# Patient Record
Sex: Male | Born: 1990 | Race: Black or African American | Hispanic: No | Marital: Married | Smoking: Never smoker
Health system: Southern US, Community
[De-identification: ages and names within clinical notes are randomized; demographics above are authoritative.]

## PROBLEM LIST (undated history)

## (undated) DIAGNOSIS — G43909 Migraine, unspecified, not intractable, without status migrainosus: Secondary | ICD-10-CM

## (undated) DIAGNOSIS — J45909 Unspecified asthma, uncomplicated: Secondary | ICD-10-CM

## (undated) HISTORY — PX: SKIN GRAFT: SHX250

---
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
## 2011-04-10 LAB — URINALYSIS, ROUTINE W REFLEX MICROSCOPIC
Ketones, ur: NEGATIVE
Leukocytes, UA: NEGATIVE
Protein, ur: 30 — AB
Specific Gravity, Urine: 1.021
Urobilinogen, UA: 0.2

## 2011-04-10 LAB — URINE MICROSCOPIC-ADD ON

## 2011-04-10 LAB — DIFFERENTIAL
Basophils Relative: 0
Eosinophils Relative: 6 — ABNORMAL HIGH
Neutro Abs: 3.4

## 2011-04-10 LAB — CBC
HCT: 42.6
Hemoglobin: 14.8
Platelets: 177
RBC: 4.52
WBC: 5.7

## 2011-04-10 LAB — BASIC METABOLIC PANEL
CO2: 22
Calcium: 9.2
Creatinine, Ser: 0.86
GFR calc non Af Amer: >60
Glucose, Bld: 113 — ABNORMAL HIGH
Sodium: 138

## 2011-04-10 LAB — RAPID URINE DRUG SCREEN, HOSP PERFORMED
Amphetamines: NOT DETECTED
Benzodiazepines: NOT DETECTED
Cocaine: NOT DETECTED
Opiates: NOT DETECTED
Tetrahydrocannabinol: NOT DETECTED

## 2012-03-04 ENCOUNTER — Encounter (HOSPITAL_COMMUNITY): Payer: Self-pay | Admitting: *Deleted

## 2012-03-04 ENCOUNTER — Emergency Department (HOSPITAL_COMMUNITY)
Admission: EM | Admit: 2012-03-04 | Discharge: 2012-03-04 | Disposition: A | Discharge status: Home / Self Care | Payer: Self-pay | Attending: Emergency Medicine | Admitting: Emergency Medicine

## 2012-03-04 DIAGNOSIS — J40 Bronchitis, not specified as acute or chronic: Secondary | ICD-10-CM | POA: Insufficient documentation

## 2012-03-04 DIAGNOSIS — J45901 Unspecified asthma with (acute) exacerbation: Secondary | ICD-10-CM | POA: Insufficient documentation

## 2012-03-04 HISTORY — DX: Unspecified asthma, uncomplicated: J45.909

## 2012-03-04 MED ORDER — ALBUTEROL SULFATE HFA 108 (90 BASE) MCG/ACT IN AERS
2.0000 | INHALATION_SPRAY | Freq: Once | RESPIRATORY_TRACT | Status: AC
  Administered 2012-03-04: 2 via RESPIRATORY_TRACT
  Filled 2012-03-04: qty 6.7

## 2012-03-04 MED ORDER — AEROCHAMBER Z-STAT PLUS/MEDIUM MISC
1.0000 | Freq: Once | Status: AC
  Administered 2012-03-04: 1
  Filled 2012-03-04: qty 1

## 2012-03-04 MED ORDER — AMOXICILLIN 500 MG PO CAPS: 500.0000 mg | ORAL_CAPSULE | Freq: Three times a day (TID) | ORAL | Status: AC

## 2012-03-04 MED ORDER — PREDNISONE 20 MG PO TABS: ORAL_TABLET | ORAL | Status: DC

## 2012-03-04 MED ORDER — PREDNISONE 20 MG PO TABS
60.0000 mg | ORAL_TABLET | Freq: Once | ORAL | Status: AC
  Administered 2012-03-04: 60 mg via ORAL
  Filled 2012-03-04: qty 3

## 2012-03-04 NOTE — ED Provider Notes (Signed)
History     CSN: [id number]  Arrival date & time 03/04/12  0818   First MD Initiated Contact with Patient 03/04/12 0901      Chief Complaint  Patient presents with  . Asthma    (Consider location/radiation/quality/duration/timing/severity/associated sxs/prior treatment) HPI  Patient reports he has had a history of asthma since he was a child. He relates it was worse when he was a child and it seems to be getting better as an adult. He relates the last time he had to come to the ED was over a year ago. He states he's never had to be hospitalized for his asthma. He states it mainly bothers him at changes of weather or if he gets cold symptoms. He states last night he started having a cough with yellow sputum production and he is having yellow rhinorrhea. He's unsure if he is having fever however he states he's been sweating. He denies sore throat, nausea, vomiting, or diarrhea. He states he is wheezing in his chest feels tight. He states he does not have an inhaler. Patient reports he moved here 3 months ago and does not have a primary care doctor. He is unfamiliar with the term steroids or prednisone.  PCP none  Past Medical History  Diagnosis Date  . Asthma     Past Surgical History  Procedure Date  . Skin graft     right hand    No family history on file.  History  Substance Use Topics  . Smoking status: Never Smoker   . Smokeless tobacco: Not on file  . Alcohol Use: Yes   Employed in [employer] moved here 3 months ago  Review of Systems  All other systems reviewed and are negative.    Allergies  Review of patient's allergies indicates no known allergies.  Home Medications   Current Outpatient Rx  Name Route Sig Dispense Refill  . ASPIRIN EFFERVESCENT 325 MG PO TBEF Oral Take 325 mg by mouth every 6 (six) hours as needed. For cold /sinus      BP 129/79  Pulse 77  Temp 98.3 F (36.8 C) (Oral)  Resp 20  Ht 6' (1.829 m)  Wt 164 lb (74.39 kg)  BMI 22.24  kg/m2  SpO2 95%  Vital signs normal    Physical Exam  Nursing note and vitals reviewed. Constitutional: He is oriented to person, place, and time. He appears well-developed and well-nourished.  Non-toxic appearance. He does not appear ill. No distress.  HENT:  Head: Normocephalic and atraumatic.  Right Ear: External ear normal.  Left Ear: External ear normal.  Nose: Nose normal. No mucosal edema or rhinorrhea.  Mouth/Throat: Oropharynx is clear and moist and mucous membranes are normal. No dental abscesses or uvula swelling.  Eyes: Conjunctivae and EOM are normal. Pupils are equal, round, and reactive to light.  Neck: Normal range of motion and full passive range of motion without pain. Neck supple.  Cardiovascular: Normal rate, regular rhythm and normal heart sounds.  Exam reveals no gallop and no friction rub.   No murmur heard. Pulmonary/Chest: Effort normal. No respiratory distress. He has wheezes. He has no rhonchi. He has no rales. He exhibits no tenderness and no crepitus.       Patient has diffuse bilateral end expiratory high-pitched wheezing scattered  Abdominal: Soft. Normal appearance and bowel sounds are normal. He exhibits no distension. There is no tenderness. There is no rebound and no guarding.  Musculoskeletal: Normal range of motion. He exhibits no  edema and no tenderness.       Moves all extremities well.   Neurological: He is alert and oriented to person, place, and time. He has normal strength. No cranial nerve deficit.  Skin: Skin is warm, dry and intact. No rash noted. No erythema. No pallor.  Psychiatric: He has a normal mood and affect. His speech is normal and behavior is normal. His mood appears not anxious.    ED Course  Procedures (including critical care time)    Medications  albuterol (PROVENTIL HFA;VENTOLIN HFA) 108 (90 BASE) MCG/ACT inhaler (not administered)  aerochamber Z-Stat Plus/medium 1 each (not administered)  albuterol (PROVENTIL  HFA;VENTOLIN HFA) 108 (90 BASE) MCG/ACT inhaler 2 puff (2 puff Inhalation Given 03/04/12 0923)  predniSONE (DELTASONE) tablet 60 mg (60 mg Oral Given 03/04/12 0924)   Recheck 10:25 after first 2 puffs. Has improved air movement with end expir rhonchi mainly on the right. Will repeat.  Recheck 11:30 recheck after second round of 2 puffs of albuterol inhaler has faint rare inspir wheeze feels ready to go home.   1. Asthma exacerbation   2. Bronchitis    New Prescriptions   AMOXICILLIN (AMOXIL) 500 MG CAPSULE    Take 1 capsule (500 mg total) by mouth 3 (three) times daily.   PREDNISONE (DELTASONE) 20 MG TABLET    Take 3 po QD x 2d starting tomorrow, then 2 po QD x 3d then 1 po QD x 3d    Plan discharge  Devoria Albe, MD, FACEP    MDM          Ward Givens, MD 03/04/12 [phone]

## 2012-03-04 NOTE — ED Notes (Signed)
Pt took 2 more puffs of albuterol inhaler

## 2012-03-04 NOTE — ED Notes (Signed)
Pt from home, reports hx of asthma, has been wheezing intermittently since last night. Woke up this morning still wheezing. Reports URI symptoms x 3 days - Non-productive cough and nasal drainage present. Pt states he uses inhaler but does not currently have one.

## 2013-01-27 ENCOUNTER — Emergency Department (HOSPITAL_COMMUNITY)
Admission: EM | Admit: 2013-01-27 | Discharge: 2013-01-27 | Disposition: A | Discharge status: Home / Self Care | Payer: 59 | Attending: Emergency Medicine | Admitting: Emergency Medicine

## 2013-01-27 ENCOUNTER — Emergency Department (HOSPITAL_COMMUNITY): Payer: 59

## 2013-01-27 DIAGNOSIS — Z79899 Other long term (current) drug therapy: Secondary | ICD-10-CM | POA: Insufficient documentation

## 2013-01-27 DIAGNOSIS — J3489 Other specified disorders of nose and nasal sinuses: Secondary | ICD-10-CM | POA: Insufficient documentation

## 2013-01-27 DIAGNOSIS — R059 Cough, unspecified: Secondary | ICD-10-CM | POA: Insufficient documentation

## 2013-01-27 DIAGNOSIS — J019 Acute sinusitis, unspecified: Secondary | ICD-10-CM | POA: Insufficient documentation

## 2013-01-27 DIAGNOSIS — R0989 Other specified symptoms and signs involving the circulatory and respiratory systems: Secondary | ICD-10-CM | POA: Insufficient documentation

## 2013-01-27 DIAGNOSIS — J329 Chronic sinusitis, unspecified: Secondary | ICD-10-CM

## 2013-01-27 DIAGNOSIS — J029 Acute pharyngitis, unspecified: Secondary | ICD-10-CM | POA: Insufficient documentation

## 2013-01-27 DIAGNOSIS — R05 Cough: Secondary | ICD-10-CM | POA: Insufficient documentation

## 2013-01-27 DIAGNOSIS — J45909 Unspecified asthma, uncomplicated: Secondary | ICD-10-CM | POA: Insufficient documentation

## 2013-01-27 MED ORDER — ALBUTEROL SULFATE (5 MG/ML) 0.5% IN NEBU
5.0000 mg | INHALATION_SOLUTION | Freq: Once | RESPIRATORY_TRACT | Status: AC
  Administered 2013-01-27: 5 mg via RESPIRATORY_TRACT
  Filled 2013-01-27: qty 1

## 2013-01-27 MED ORDER — IPRATROPIUM BROMIDE 0.02 % IN SOLN
0.5000 mg | Freq: Once | RESPIRATORY_TRACT | Status: AC
  Administered 2013-01-27: 0.5 mg via RESPIRATORY_TRACT
  Filled 2013-01-27: qty 2.5

## 2013-01-27 MED ORDER — ALBUTEROL SULFATE HFA 108 (90 BASE) MCG/ACT IN AERS
2.0000 | INHALATION_SPRAY | Freq: Once | RESPIRATORY_TRACT | Status: AC
  Administered 2013-01-27: 2 via RESPIRATORY_TRACT
  Filled 2013-01-27: qty 6.7

## 2013-01-27 MED ORDER — AMOXICILLIN 500 MG PO CAPS: 500.0000 mg | ORAL_CAPSULE | Freq: Three times a day (TID) | ORAL | Status: DC

## 2013-01-27 NOTE — ED Notes (Signed)
PA at bedside.

## 2013-01-27 NOTE — ED Provider Notes (Signed)
CSN: [id number]     Arrival date & time 01/27/13  1250 History     First MD Initiated Contact with Patient 01/27/13 1316     Chief Complaint  Patient presents with  . Headache   (Consider location/radiation/quality/duration/timing/severity/associated sxs/prior Treatment) HPI Comments: Patient presents to the emergency department with chief complaint of persistent sinus and chest congestion. He states that this been ongoing for the past week. He endorses nasal discharge, sore throat, productive cough, and thinks that his asthma might be flaring up some. Additionally, he states that he has had a sinus headache. He is tried taking NyQuil and Alka-Seltzer with some relief. He denies fevers, chills, nausea, vomiting, diarrhea, constipation, numbness or tingling of the extremities.  The history is provided by the patient. No language interpreter was used.    Past Medical History  Diagnosis Date  . Asthma    Past Surgical History  Procedure Laterality Date  . Skin graft      right hand   No family history on file. History  Substance Use Topics  . Smoking status: Never Smoker   . Smokeless tobacco: Not on file  . Alcohol Use: Yes    Review of Systems  All other systems reviewed and are negative.    Allergies  Review of patient's allergies indicates no known allergies.  Home Medications   Current Outpatient Rx  Name  Route  Sig  Dispense  Refill  . albuterol (PROVENTIL HFA;VENTOLIN HFA) 108 (90 BASE) MCG/ACT inhaler   Inhalation   Inhale 2 puffs into the lungs every 6 (six) hours as needed. breathing         . aspirin-sod bicarb-citric acid (ALKA-SELTZER) 325 MG TBEF   Oral   Take 325 mg by mouth every 6 (six) hours as needed. For cold /sinus         . Pseudoeph-Doxylamine-DM-APAP (NYQUIL MULTI-SYMPTOM PO)   Oral   Take by mouth.         . predniSONE (DELTASONE) 20 MG tablet      Take 3 po QD x 2d starting tomorrow, then 2 po QD x 3d then 1 po QD x 3d   15  tablet   0    BP 125/68  Pulse 75  Temp(Src) 97.8 F (36.6 C) (Oral)  Resp 18  SpO2 98% Physical Exam  Nursing note and vitals reviewed. Constitutional: He is oriented to person, place, and time. He appears well-developed and well-nourished.  HENT:  Head: Normocephalic and atraumatic.  Right Ear: External ear normal.  Left Ear: External ear normal.  Mouth/Throat: Oropharynx is clear and moist. No oropharyngeal exudate.  Swollen, erythematous turbinates, maxillary sinuses tender to palpation  Eyes: Conjunctivae and EOM are normal. Pupils are equal, round, and reactive to light.  Neck: Normal range of motion. Neck supple.  Cardiovascular: Normal rate, regular rhythm and normal heart sounds.   Pulmonary/Chest: Effort normal and breath sounds normal. No respiratory distress. He has no wheezes. He has no rales. He exhibits no tenderness.  Abdominal: Soft. Bowel sounds are normal.  Musculoskeletal: Normal range of motion.  Neurological: He is alert and oriented to person, place, and time.  Skin: Skin is warm and dry.  Psychiatric: He has a normal mood and affect. His behavior is normal. Judgment and thought content normal.    ED Course   Procedures (including critical care time) Dg Chest 2 View  01/27/2013   *RADIOLOGY REPORT*  Clinical Data: Shortness of breath  CHEST - 2 VIEW  Comparison:  December 30, 2006  Findings: Lungs clear.  Heart size and pulmonary vascularity are normal.  No adenopathy.  No bone lesions.  IMPRESSION: No abnormality noted.   Original Report Authenticated By: Bretta Bang, M.D.     1. Sinusitis     MDM  Patient with sinus congestion and pressure times one week. Patient states that he thinks his asthma is acting up. Will give him a nebulizer treatment.  3:33 PM Patient states that he is feeling much better after nebulizer treatment. Will discharge to home with antibiotics for sinusitis. Will give the patient an inhaler to go home with as well. Patient is  stable and ready for discharge. He is agreeable with the plan.  Roxy Horseman, PA-C 01/27/13 [phone]

## 2013-01-27 NOTE — Progress Notes (Signed)
Patient cofirms his pcp is with [facility].

## 2013-01-27 NOTE — ED Notes (Addendum)
Patient reports sore throat times one week and 8/10 headache starting yesterday afternoon, which patient believes to be sinus related and states it is aggravating his asthma, leading to chest tightness. Patient reports that chest tightness feels similar to other asthma flare-ups. Patient denies dizziness, SOB, blurred vision.

## 2013-01-29 NOTE — ED Provider Notes (Signed)
Medical screening examination/treatment/procedure(s) were conducted as a shared visit with non-physician practitioner(s) or resident  and myself.  I personally evaluated the patient during the encounter and agree with the findings and plan unless otherwise indicated.  URI.  Well appearing in ED. CXR no acute findings, reviewed.   Fup discussed.   Enid Skeens, MD 01/29/13 2207

## 2013-06-20 ENCOUNTER — Encounter (HOSPITAL_COMMUNITY): Payer: Self-pay | Admitting: Emergency Medicine

## 2013-06-20 ENCOUNTER — Emergency Department (HOSPITAL_COMMUNITY)
Admission: EM | Admit: 2013-06-20 | Discharge: 2013-06-20 | Discharge status: Against Medical Advice | Payer: 59 | Attending: Emergency Medicine | Admitting: Emergency Medicine

## 2013-06-20 DIAGNOSIS — Y9367 Activity, basketball: Secondary | ICD-10-CM | POA: Insufficient documentation

## 2013-06-20 DIAGNOSIS — Y9239 Other specified sports and athletic area as the place of occurrence of the external cause: Secondary | ICD-10-CM | POA: Insufficient documentation

## 2013-06-20 DIAGNOSIS — X500XXA Overexertion from strenuous movement or load, initial encounter: Secondary | ICD-10-CM | POA: Insufficient documentation

## 2013-06-20 DIAGNOSIS — J45909 Unspecified asthma, uncomplicated: Secondary | ICD-10-CM | POA: Insufficient documentation

## 2013-06-20 DIAGNOSIS — IMO0002 Reserved for concepts with insufficient information to code with codable children: Secondary | ICD-10-CM | POA: Insufficient documentation

## 2013-06-20 NOTE — ED Notes (Signed)
Unable to locate patient for room x4

## 2013-06-20 NOTE — ED Notes (Signed)
Pt in stating he thinks he pulled his right groin muscle a few days ago while playing basketball, some relief at home with ibuprofen and icy-hot, pain has continued

## 2013-10-22 ENCOUNTER — Ambulatory Visit (INDEPENDENT_AMBULATORY_CARE_PROVIDER_SITE_OTHER): Payer: 59

## 2013-10-22 VITALS — BP 124/72 | HR 68 | Resp 12

## 2013-10-22 DIAGNOSIS — T148XXA Other injury of unspecified body region, initial encounter: Secondary | ICD-10-CM

## 2013-10-22 DIAGNOSIS — M24873 Other specific joint derangements of unspecified ankle, not elsewhere classified: Secondary | ICD-10-CM

## 2013-10-22 DIAGNOSIS — M25372 Other instability, left ankle: Secondary | ICD-10-CM

## 2013-10-22 DIAGNOSIS — M24876 Other specific joint derangements of unspecified foot, not elsewhere classified: Secondary | ICD-10-CM

## 2013-10-22 DIAGNOSIS — R52 Pain, unspecified: Secondary | ICD-10-CM

## 2013-10-22 DIAGNOSIS — S93409A Sprain of unspecified ligament of unspecified ankle, initial encounter: Secondary | ICD-10-CM

## 2013-10-22 DIAGNOSIS — M779 Enthesopathy, unspecified: Secondary | ICD-10-CM

## 2013-10-22 DIAGNOSIS — IMO0002 Reserved for concepts with insufficient information to code with codable children: Secondary | ICD-10-CM

## 2013-10-22 NOTE — Patient Instructions (Signed)

## 2013-10-22 NOTE — Progress Notes (Signed)
   Subjective:    Patient ID: Roy Morse, male    DOB: [birth year], 23 y.o.   MRN: [id number]  HPI PT STATED LT FOOT ANKLE IS POPPING WHEN WALKING AND PAINFUL FOR 2 MONTHS. THE LT FOOT ANKLE IS ABOUT THE SAME NOT GETTING WORSE. THE FOOT GET AGGRAVATED WHEN WALKING. TRIED ICE, ICY HEAT, AND SOAKING EPSON SALT AND IT HELP SOME.    Review of Systems  All other systems reviewed and are negative.      Objective:   Physical Exam Neurovascular status is intact pedal pulses palpable DP and PT posterior were for Refill timed 3-4 seconds skin temperature warm turgor normal no edema rubor varicosity noted neurologically epicritic and proprioceptive sensations intact there is normal plantar response DTRs not listed neurologically skin color pigment normal hair growth intact nails unremarkable orthopedic exam there is pain on inversion and palpation of the sinus tarsi slight crepitus is noted inversion eversion dorsal flexion plantarflexion of the ankle. X-rays reveal some peritubular spurring of the medial malleolar area some asymmetric joint space narrowing of the ankle mortise posse some osteophyte over the anterior ankle mortise on lateral projection. Cannot rule out a tear for ligament injury versus chronic ankle stability or possibly osteochondral injury of the ankle mortise.       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 Recommendation at this time his mobilization followup with MRI and possible referral for ankle arthroscopy next progress Schedule MRI for evaluation at this time maintain ankle stabilizer and Advil or ibuprofen as needed for pain also recommended ice packs   Roy Morse DPM

## 2013-10-27 ENCOUNTER — Telehealth: Payer: Self-pay | Admitting: *Deleted

## 2013-10-27 NOTE — Telephone Encounter (Signed)
I called [employer] to get authorization for an MRI scheduled for 10/28/2013.  After a series of questions, I received authorization.  It was authorized for 45 calendar days, 12/11/2013.  Authorization number is [id number].

## 2013-10-27 NOTE — Telephone Encounter (Signed)
[facility]  His appointment is tomorrow.  CPT Code is 73721.

## 2013-10-27 NOTE — Telephone Encounter (Signed)
I called and informed her we got authorization.  Authorization number is [id number].  Valid until 12/11/2013

## 2013-10-28 ENCOUNTER — Other Ambulatory Visit: Payer: 59

## 2013-10-28 ENCOUNTER — Ambulatory Visit
Admission: RE | Admit: 2013-10-28 | Discharge: 2013-10-28 | Disposition: A | Discharge status: Home / Self Care | Payer: 59 | Source: Ambulatory Visit

## 2013-10-28 DIAGNOSIS — T148XXA Other injury of unspecified body region, initial encounter: Secondary | ICD-10-CM

## 2013-10-28 DIAGNOSIS — IMO0002 Reserved for concepts with insufficient information to code with codable children: Secondary | ICD-10-CM

## 2013-10-31 ENCOUNTER — Telehealth: Payer: Self-pay | Admitting: *Deleted

## 2013-10-31 NOTE — Telephone Encounter (Signed)
I called and informed the patient that he does have a torn anterior talofibular ligament.  Dr. Ralene CorkSikora has referred you to Dr. Victorino DikeHewitt.  Your appointment is scheduled for 12/19/13 and time for arrival is 3pm.  He asked what appointment with Dr. Victorino DikeHewitt is for.  I told him he may do surgery.  I informed him he will need to take a copy of his xrays.  He said he already has a copy.  I also informed him he will need to see Dr. Ralene CorkSikora for detailed information about the MRI.  I transferred him to a scheduler to make an appointment with Dr. Ralene CorkSikora.

## 2013-10-31 NOTE — Telephone Encounter (Signed)
Calling because I was sent for a MRI.  I'm calling to find out the results.

## 2013-11-12 ENCOUNTER — Ambulatory Visit: Payer: 59

## 2013-11-14 ENCOUNTER — Telehealth: Payer: Self-pay | Admitting: *Deleted

## 2013-11-14 ENCOUNTER — Ambulatory Visit (INDEPENDENT_AMBULATORY_CARE_PROVIDER_SITE_OTHER): Payer: 59

## 2013-11-14 VITALS — BP 115/67 | HR 77 | Resp 16

## 2013-11-14 DIAGNOSIS — M958 Other specified acquired deformities of musculoskeletal system: Secondary | ICD-10-CM

## 2013-11-14 DIAGNOSIS — R52 Pain, unspecified: Secondary | ICD-10-CM

## 2013-11-14 DIAGNOSIS — M779 Enthesopathy, unspecified: Secondary | ICD-10-CM

## 2013-11-14 DIAGNOSIS — S93409A Sprain of unspecified ligament of unspecified ankle, initial encounter: Secondary | ICD-10-CM

## 2013-11-14 DIAGNOSIS — M25372 Other instability, left ankle: Secondary | ICD-10-CM

## 2013-11-14 DIAGNOSIS — M24873 Other specific joint derangements of unspecified ankle, not elsewhere classified: Secondary | ICD-10-CM

## 2013-11-14 DIAGNOSIS — M24876 Other specific joint derangements of unspecified foot, not elsewhere classified: Secondary | ICD-10-CM

## 2013-11-14 DIAGNOSIS — M959 Acquired deformity of musculoskeletal system, unspecified: Secondary | ICD-10-CM

## 2013-11-14 NOTE — Progress Notes (Signed)
   Subjective:    Patient ID: Roy Morse, male    DOB: 1991-03-19, 23 y.o.   MRN: [id number]  HPI Comments: "Its about the same, not any better"  Follow up left ankle pain and MRI results  Foot Pain      Review of Systems no new findings or systemic changes noted     Objective:   Physical Exam 23 year old male well-developed well-nourished returns she continues to have pain in the ankle and time he starts moving or do anything for 15-20 minutes starts having pain points to the lateral ankle gutter area anterior talofibular ligament and ankle mortise area on the left ankle. The MRI confirmed a tear of the anterior talofibular ligament as well as osteochondral defect of the talar dome. Patient indicates that her all the time sometimes it catches when he walks and there is some impingement of motion the ankle at times. No significant swelling is identified at this time not any medial ankle pain there is also probable deltoid ligament and some a flexor hallucis longus tendinosis or tendinitis these are relatively asymptomatic may just be compensatory.       Assessment & Plan:  Assessment ankle sprain with torn EPF an osteochondral defect left ankle referral to Dr. Romualdo Bolk is carried out at this time for possible surgical consult for either arthroscopic procedure or open procedure and ATF repair. In the interim maintain a stable shoe warm compress ice pack and NSAID therapy as needed.  Alvan Dame DPM

## 2013-11-14 NOTE — Telephone Encounter (Signed)
Referral was made to Dr. Avel Sensor Dial to treat for torn ATF and Osteochondral Defect per Dr. Ralene Cork.  Appointment is on 11/18/2013 at 3:30pm.  I called and left the patient a message of the appointment.   Dr. Avel Sensor Dial [address] [place], [place] [address]

## 2013-11-14 NOTE — Patient Instructions (Signed)
Ankle Sprain An ankle sprain is an injury to the strong, fibrous tissues (ligaments) that hold the bones of your ankle joint together.  CAUSES An ankle sprain is usually caused by a fall or by twisting your ankle. Ankle sprains most commonly occur when you step on the outer edge of your foot, and your ankle turns inward. People who participate in sports are more prone to these types of injuries.  SYMPTOMS   Pain in your ankle. The pain may be present at rest or only when you are trying to stand or walk.  Swelling.  Bruising. Bruising may develop immediately or within 1 to 2 days after your injury.  Difficulty standing or walking, particularly when turning corners or changing directions. DIAGNOSIS  Your caregiver will ask you details about your injury and perform a physical exam of your ankle to determine if you have an ankle sprain. During the physical exam, your caregiver will press on and apply pressure to specific areas of your foot and ankle. Your caregiver will try to move your ankle in certain ways. An X-ray exam may be done to be sure a bone was not broken or a ligament did not separate from one of the bones in your ankle (avulsion fracture).  TREATMENT  Certain types of braces can help stabilize your ankle. Your caregiver can make a recommendation for this. Your caregiver may recommend the use of medicine for pain. If your sprain is severe, your caregiver may refer you to a surgeon who helps to restore function to parts of your skeletal system (orthopedist) or a physical therapist. HOME CARE INSTRUCTIONS   Apply ice to your injury for 1 2 days or as directed by your caregiver. Applying ice helps to reduce inflammation and pain.  Put ice in a plastic bag.  Place a towel between your skin and the bag.  Leave the ice on for 15-20 minutes at a time, every 2 hours while you are awake.  Only take over-the-counter or prescription medicines for pain, discomfort, or fever as directed by  your caregiver.  Elevate your injured ankle above the level of your heart as much as possible for 2 3 days.  If your caregiver recommends crutches, use them as instructed. Gradually put weight on the affected ankle. Continue to use crutches or a cane until you can walk without feeling pain in your ankle.  If you have a plaster splint, wear the splint as directed by your caregiver. Do not rest it on anything harder than a pillow for the first 24 hours. Do not put weight on it. Do not get it wet. You may take it off to take a shower or bath.  You may have been given an elastic bandage to wear around your ankle to provide support. If the elastic bandage is too tight (you have numbness or tingling in your foot or your foot becomes cold and blue), adjust the bandage to make it comfortable.  If you have an air splint, you may blow more air into it or let air out to make it more comfortable. You may take your splint off at night and before taking a shower or bath. Wiggle your toes in the splint several times per day to decrease swelling. SEEK MEDICAL CARE IF:   You have rapidly increasing bruising or swelling.  Your toes feel extremely cold or you lose feeling in your foot.  Your pain is not relieved with medicine. SEEK IMMEDIATE MEDICAL CARE IF:  Your toes are numb   or blue.  You have severe pain that is increasing. MAKE SURE YOU:   Understand these instructions.  Will watch your condition.  Will get help right away if you are not doing well or get worse. Document Released: 06/12/2005 Document Revised: 03/06/2012 Document Reviewed: 06/24/2011 [facility] Patient Information 2014 [place], [place].   You have an ankle sprain which resulted in a torn anterior talofibular ligament as well as an osteochondral defect of the talar dome/ankle bone  Your been referred to Dr. Lynnell Grain Dial, with [facility] in [place] [facility], you may be candidate for ankle surgery possibly  arthroscopic surgery and repair of ATF ligament.

## 2013-12-17 ENCOUNTER — Encounter (HOSPITAL_COMMUNITY): Payer: Self-pay | Admitting: Emergency Medicine

## 2013-12-17 ENCOUNTER — Emergency Department (HOSPITAL_COMMUNITY)
Admission: EM | Admit: 2013-12-17 | Discharge: 2013-12-17 | Disposition: A | Discharge status: Home / Self Care | Payer: 59 | Attending: Emergency Medicine | Admitting: Emergency Medicine

## 2013-12-17 DIAGNOSIS — Z79899 Other long term (current) drug therapy: Secondary | ICD-10-CM | POA: Insufficient documentation

## 2013-12-17 DIAGNOSIS — R5383 Other fatigue: Secondary | ICD-10-CM

## 2013-12-17 DIAGNOSIS — G44219 Episodic tension-type headache, not intractable: Secondary | ICD-10-CM

## 2013-12-17 DIAGNOSIS — G44209 Tension-type headache, unspecified, not intractable: Secondary | ICD-10-CM | POA: Insufficient documentation

## 2013-12-17 DIAGNOSIS — J45909 Unspecified asthma, uncomplicated: Secondary | ICD-10-CM | POA: Insufficient documentation

## 2013-12-17 DIAGNOSIS — R5381 Other malaise: Secondary | ICD-10-CM | POA: Insufficient documentation

## 2013-12-17 HISTORY — DX: Migraine, unspecified, not intractable, without status migrainosus: G43.909

## 2013-12-17 MED ORDER — BUTALBITAL-APAP-CAFFEINE 50-325-40 MG PO TABS: 1.0000 | ORAL_TABLET | Freq: Four times a day (QID) | ORAL | Status: AC | PRN

## 2013-12-17 NOTE — Discharge Instructions (Signed)

## 2013-12-17 NOTE — ED Notes (Signed)
Pt c/o migraine and increased fatigue x 6 days.  Pain score 2/10.  Denies blurred vision.  Hx of migraines.  Pt reports "the pain is making me feel weak and drained."  Sts OTC pain medication use w/o complete relief.

## 2013-12-17 NOTE — ED Provider Notes (Signed)
CSN: [id number]     Arrival date & time 12/17/13  1322 History   First MD Initiated Contact with Patient 12/17/13 1342     Chief Complaint  Patient presents with  . Migraine     (Consider location/radiation/quality/duration/timing/severity/associated sxs/prior Treatment) Patient is a 23 y.o. male presenting with migraines. The history is provided by the patient. No language interpreter was used.  Migraine This is a new problem. The current episode started in the past 7 days. The problem occurs 2 to 4 times per day. The problem has been unchanged. Associated symptoms include fatigue, headaches, nausea and a visual change. Pertinent negatives include no chills, diaphoresis, fever, numbness, rash, vertigo or weakness. Exacerbated by: light. He has tried NSAIDs and acetaminophen for the symptoms. The treatment provided mild relief.    Past Medical History  Diagnosis Date  . Asthma   . Migraine    Past Surgical History  Procedure Laterality Date  . Skin graft      right hand   History reviewed. No pertinent family history. History  Substance Use Topics  . Smoking status: Never Smoker   . Smokeless tobacco: Not on file  . Alcohol Use: Yes    Review of Systems  Constitutional: Positive for fatigue. Negative for fever, chills and diaphoresis.  Gastrointestinal: Positive for nausea.  Skin: Negative for rash.  Neurological: Positive for headaches. Negative for vertigo, weakness and numbness.  All other systems reviewed and are negative.     Allergies  Review of patient's allergies indicates no known allergies.  Home Medications   Prior to Admission medications   Medication Sig Start Date End Date Taking? Authorizing Provider  albuterol (PROVENTIL HFA;VENTOLIN HFA) 108 (90 BASE) MCG/ACT inhaler Inhale 2 puffs into the lungs every 6 (six) hours as needed. breathing    Historical Provider, MD  aspirin-sod bicarb-citric acid (ALKA-SELTZER) 325 MG TBEF Take 325 mg by mouth every 6  (six) hours as needed. For cold /sinus    Historical Provider, MD   BP 116/70  Pulse 69  Temp(Src) 97.6 F (36.4 C) (Oral)  Resp 16  SpO2 97% Physical Exam  Constitutional: He is oriented to person, place, and time. He appears well-developed and well-nourished. No distress.  HENT:  Head: Normocephalic and atraumatic.  Right Ear: External ear normal.  Left Ear: External ear normal.  Mouth/Throat: Oropharynx is clear and moist.  Eyes: Conjunctivae are normal.  Neck: Normal range of motion. Neck supple.  No nuchal rigidity  Cardiovascular: Normal rate and regular rhythm.   Pulmonary/Chest: Effort normal and breath sounds normal. No respiratory distress. He has no wheezes.  Abdominal: Soft. There is no tenderness.  Musculoskeletal: He exhibits no edema.  Neurological: He is alert and oriented to person, place, and time.  Neurologic exam:  Speech clear, pupils equal round reactive to light, extraocular movements intact  Normal peripheral visual fields Cranial nerves III through XII normal including no facial droop Follows commands, moves all extremities x4, normal strength to bilateral upper and lower extremities at all major muscle groups including grip Sensation normal to light touch  Coordination intact, no limb ataxia, finger-nose-finger normal Rapid alternating movements normal No pronator drift Gait normal   Skin: No rash noted.  Psychiatric: He has a normal mood and affect.    ED Course  Procedures (including critical care time)  2:01 PM Patient presents for evaluation of headache. No red flags. No sudden onset worst headache of life concerning for subarachnoid hemorrhage, no fever, neck stiffness or meningismal  sign concerning for meningitis, no retractions and no focal neuro deficits concerning for strokes or cancer. Headache is minimal at this time. Patient is here for evaluation of headache because his wife urge him to do so. I offer migraine cocktail the patient fell  he can take over-the-counter medication as needed. We'll give referral to neurology as needed. Otherwise patient has no focal neuro deficit and no significant finding on initial exam. Return precautions discussed.  Labs Review Labs Reviewed - No data to display  Imaging Review No results found.   EKG Interpretation None      MDM   Final diagnoses:  Episodic tension-type headache, not intractable    BP 116/70  Pulse 69  Temp(Src) 97.6 F (36.4 C) (Oral)  Resp 16  SpO2 97%     Fayrene HelperBowie Tran, PA-C 12/17/13 1416

## 2013-12-18 NOTE — ED Provider Notes (Signed)
Medical screening examination/treatment/procedure(s) were performed by non-physician practitioner and as supervising physician I was immediately available for consultation/collaboration.   EKG Interpretation None        Joseph L Zammit, MD 12/18/13 1016 

## 2014-06-29 ENCOUNTER — Ambulatory Visit (INDEPENDENT_AMBULATORY_CARE_PROVIDER_SITE_OTHER): Payer: 59 | Admitting: Emergency Medicine

## 2014-06-29 VITALS — BP 110/70 | HR 72 | Temp 97.8°F | Resp 16 | Ht 72.0 in | Wt 174.0 lb

## 2014-06-29 DIAGNOSIS — J209 Acute bronchitis, unspecified: Secondary | ICD-10-CM

## 2014-06-29 MED ORDER — CLARITHROMYCIN 500 MG PO TABS: 500.0000 mg | ORAL_TABLET | Freq: Two times a day (BID) | ORAL | Status: AC

## 2014-06-29 MED ORDER — HYDROCOD POLST-CHLORPHEN POLST 10-8 MG/5ML PO LQCR: 5.0000 mL | Freq: Two times a day (BID) | ORAL | Status: AC | PRN

## 2014-06-29 NOTE — Progress Notes (Signed)
[facility] [address] [place] [address] [phone]  Date:  06/29/2014   Name:  Roy Morse   DOB:  1990/10/02   MRN:  [id number]  PCP:  Beverley Fiedler, MD    Chief Complaint: Cough and Sore Throat   History of Present Illness:  Roy Morse is a 24 y.o. very pleasant male patient who presents with the following:  Ill for three days with cough productive of purulent sputum No wheezing or shortness of breath No fever or chills.  No nasal congestion or drainage. No sore throat No nausea or vomiting No improvement with over the counter medications or other home remedies. . Denies other complaint or health concern today.   There are no active problems to display for this patient.   Past Medical History  Diagnosis Date  . Asthma   . Migraine     Past Surgical History  Procedure Laterality Date  . Skin graft      right hand    History  Substance Use Topics  . Smoking status: Never Smoker   . Smokeless tobacco: Not on file  . Alcohol Use: Yes    History reviewed. No pertinent family history.  No Known Allergies  Medication list has been reviewed and updated.  Current Outpatient Prescriptions on File Prior to Visit  Medication Sig Dispense Refill  . acetaminophen (TYLENOL) 325 MG tablet Take 325 mg by mouth every 6 (six) hours as needed for headache.    . albuterol (PROVENTIL HFA;VENTOLIN HFA) 108 (90 BASE) MCG/ACT inhaler Inhale 2 puffs into the lungs every 6 (six) hours as needed. breathing    . cetirizine (ZYRTEC) 10 MG tablet Take 10 mg by mouth daily.    . butalbital-acetaminophen-caffeine (FIORICET) 50-325-40 MG per tablet Take 1-2 tablets by mouth every 6 (six) hours as needed for headache. (Patient not taking: Reported on 06/29/2014) 20 tablet 0  . montelukast (SINGULAIR) 10 MG tablet Take 10 mg by mouth daily.     No current facility-administered medications on file prior to visit.    Review of Systems:  As per  HPI, otherwise negative.    Physical Examination: Filed Vitals:   06/29/14 1557  BP: 110/70  Pulse: 72  Temp: 97.8 F (36.6 C)  Resp: 16   Filed Vitals:   06/29/14 1557  Height: 6' (1.829 m)  Weight: 174 lb (78.926 kg)   Body mass index is 23.59 kg/(m^2). Ideal Body Weight: Weight in (lb) to have BMI = 25: 183.9  GEN: WDWN, NAD, Non-toxic, A & O x 3 HEENT: Atraumatic, Normocephalic. Neck supple. No masses, No LAD. Ears and Nose: No external deformity. CV: RRR, No M/G/R. No JVD. No thrill. No extra heart sounds. PULM: CTA B, no wheezes, crackles, rhonchi. No retractions. No resp. distress. No accessory muscle use. ABD: S, NT, ND, +BS. No rebound. No HSM. EXTR: No c/c/e NEURO Normal gait.  PSYCH: Normally interactive. Conversant. Not depressed or anxious appearing.  Calm demeanor.    Assessment and Plan: Bronchitis biaxin tussionex   Signed,  Phillips Odor, MD

## 2017-12-02 ENCOUNTER — Encounter (HOSPITAL_COMMUNITY): Payer: Self-pay | Admitting: Nurse Practitioner

## 2017-12-02 ENCOUNTER — Emergency Department (HOSPITAL_COMMUNITY)
Admission: EM | Admit: 2017-12-02 | Discharge: 2017-12-03 | Disposition: A | Discharge status: Home / Self Care | Payer: Self-pay | Attending: Emergency Medicine | Admitting: Emergency Medicine

## 2017-12-02 ENCOUNTER — Emergency Department (HOSPITAL_COMMUNITY): Payer: Self-pay

## 2017-12-02 DIAGNOSIS — S93401A Sprain of unspecified ligament of right ankle, initial encounter: Secondary | ICD-10-CM | POA: Insufficient documentation

## 2017-12-02 DIAGNOSIS — Y929 Unspecified place or not applicable: Secondary | ICD-10-CM | POA: Insufficient documentation

## 2017-12-02 DIAGNOSIS — Y999 Unspecified external cause status: Secondary | ICD-10-CM | POA: Insufficient documentation

## 2017-12-02 DIAGNOSIS — X500XXA Overexertion from strenuous movement or load, initial encounter: Secondary | ICD-10-CM | POA: Insufficient documentation

## 2017-12-02 DIAGNOSIS — Y9389 Activity, other specified: Secondary | ICD-10-CM | POA: Insufficient documentation

## 2017-12-02 NOTE — ED Notes (Signed)
Bed: WA02 Expected date:  Expected time:  Means of arrival:  Comments: 

## 2017-12-02 NOTE — ED Triage Notes (Signed)
Pt states he twisted his right ankle yesterday while helping a friend move.

## 2017-12-03 MED ORDER — IBUPROFEN 800 MG PO TABS
800.0000 mg | ORAL_TABLET | Freq: Once | ORAL | Status: AC
  Administered 2017-12-03: 800 mg via ORAL
  Filled 2017-12-03: qty 1

## 2017-12-03 MED ORDER — IBUPROFEN 800 MG PO TABS: 800.0000 mg | ORAL_TABLET | Freq: Three times a day (TID) | ORAL | 0 refills | Status: AC

## 2017-12-03 NOTE — Discharge Instructions (Addendum)

## 2017-12-03 NOTE — ED Provider Notes (Signed)
[facility]-EMERGENCY DEPT Provider Note   CSN: [id number] Arrival date & time: 12/02/17  2232     History   Chief Complaint Chief Complaint  Patient presents with  . Ankle Pain    HPI Roy Morse is a 27 y.o. male with a hx of asthma, migraine headaches presents to the Emergency Department complaining of acute, persistent swelling and bruising of the right ankle onset yesterday.  Patient reports he was helping a friend move a couch when he stumbled and everted the right foot.  He states immediate pain.  He has been able to weight-bear with difficulty.  He denies numbness, tingling or weakness in the right foot or leg.  No pain in his right knee or hip.  Patient reports that ibuprofen makes the symptoms better.  Attempting to walk makes them worse.  He has been using crutches which have helped as well.   The history is provided by the patient and medical records. No language interpreter was used.    Past Medical History:  Diagnosis Date  . Asthma   . Migraine     There are no active problems to display for this patient.   Past Surgical History:  Procedure Laterality Date  . SKIN GRAFT     right hand        Home Medications    Prior to Admission medications   Medication Sig Start Date End Date Taking? Authorizing Provider  acetaminophen (TYLENOL) 325 MG tablet Take 325 mg by mouth every 6 (six) hours as needed for headache.    [provider]  albuterol (PROVENTIL HFA;VENTOLIN HFA) 108 (90 BASE) MCG/ACT inhaler Inhale 2 puffs into the lungs every 6 (six) hours as needed. breathing    [provider]  cetirizine (ZYRTEC) 10 MG tablet Take 10 mg by mouth daily.    [provider]  chlorpheniramine-HYDROcodone (TUSSIONEX PENNKINETIC ER) 10-8 MG/5ML LQCR Take 5 mLs by mouth every 12 (twelve) hours as needed. 06/29/14   Carmelina Dane, MD  clarithromycin (BIAXIN) 500 MG tablet Take 1 tablet (500 mg total) by mouth 2  (two) times daily. 06/29/14   Carmelina Dane, MD  ibuprofen (ADVIL,MOTRIN) 800 MG tablet Take 1 tablet (800 mg total) by mouth 3 (three) times daily. 12/03/17   Albie Bazin, Dahlia Client, PA-C  meloxicam (MOBIC) 15 MG tablet Take 15 mg by mouth daily.    [provider]  montelukast (SINGULAIR) 10 MG tablet Take 10 mg by mouth daily.    [provider]    Family History No family history on file.  Social History Social History   Tobacco Use  . Smoking status: Never Smoker  Substance Use Topics  . Alcohol use: Yes  . Drug use: No     Allergies   Patient has no known allergies.   Review of Systems Review of Systems  Constitutional: Negative for chills and fever.  Gastrointestinal: Negative for nausea and vomiting.  Musculoskeletal: Positive for arthralgias and joint swelling. Negative for back pain, neck pain and neck stiffness.  Skin: Negative for wound.  Neurological: Negative for numbness.  Hematological: Does not bruise/bleed easily.  Psychiatric/Behavioral: The patient is not nervous/anxious.   All other systems reviewed and are negative.    Physical Exam Updated Vital Signs BP 125/65 (BP Location: Left Arm)   Pulse 71   Temp 98.3 F (36.8 C) (Oral)   Resp 18   SpO2 98%   Physical Exam  Constitutional: He appears well-developed and  well-nourished. No distress.  HENT:  Head: Normocephalic and atraumatic.  Eyes: Conjunctivae are normal.  Neck: Normal range of motion.  Cardiovascular: Normal rate, regular rhythm and intact distal pulses.  Capillary refill < 3 sec  Pulmonary/Chest: Effort normal and breath sounds normal.  Musculoskeletal: He exhibits tenderness. He exhibits no edema.  Decreased range of motion of the right ankle with significant edema and ecchymosis.  Tenderness to palpation along the medial joint line. Full range of motion of all toes of the right foot and full range of motion of the right knee.  No tenderness to palpation of  the knee.  Neurological: He is alert. Coordination normal.  Sensation intact to normal touch in the right lower extremity Strength 5/5 with flexion extension of the great toe and ankle though this does elicit pain.  Skin: Skin is warm and dry. He is not diaphoretic.  No tenting of the skin  Psychiatric: He has a normal mood and affect.  Nursing note and vitals reviewed.    ED Treatments / Results   Radiology Dg Ankle Complete Right  Result Date: 12/02/2017 CLINICAL DATA:  Twisted RIGHT ankle yesterday helping a friend move, severe swelling and pain both worsening EXAM: RIGHT ANKLE - COMPLETE 3+ VIEW COMPARISON:  None FINDINGS: Osseous mineralization normal. Ankle joint space preserved. Diffuse soft tissue swelling greatest laterally. No acute fracture, dislocation, or bone destruction. IMPRESSION: Soft tissue swelling without acute osseous abnormalities. Electronically Signed   By: Ulyses SouthwardMark  Boles M.D.   On: 12/02/2017 23:52    Procedures Procedures (including critical care time)  Medications Ordered in ED Medications  ibuprofen (ADVIL,MOTRIN) tablet 800 mg (has no administration in time range)     Initial Impression / Assessment and Plan / ED Course  I have reviewed the triage vital signs and the nursing notes.  Pertinent labs & imaging results that were available during my care of the patient were reviewed by me and considered in my medical decision making (see chart for details).     Patient X-Ray negative for obvious fracture or dislocation.  I personally evaluated these images.  Pain managed in ED. Pt advised to follow up with primary care if symptoms persist. Patient given brace while in ED, conservative therapy recommended and discussed. Patient will be dc home & is agreeable with above plan.   Final Clinical Impressions(s) / ED Diagnoses   Final diagnoses:  Sprain of right ankle, unspecified ligament, initial encounter    ED Discharge Orders        Ordered     ibuprofen (ADVIL,MOTRIN) 800 MG tablet  3 times daily     12/03/17 0002       Azlyn Wingler, Boyd KerbsHannah, PA-C 12/03/17 0009    Shaune PollackIsaacs, Cameron, MD 12/03/17 0301

## 2018-11-04 IMAGING — DX DG ANKLE COMPLETE 3+V*R*
3 series · 3 of 3 positions shown · non-contrast
Comparison: None

CLINICAL DATA: Twisted RIGHT ankle yesterday helping a friend move,
severe swelling and pain both worsening

EXAM:
RIGHT ANKLE - COMPLETE 3+ VIEW

[ankle ap]
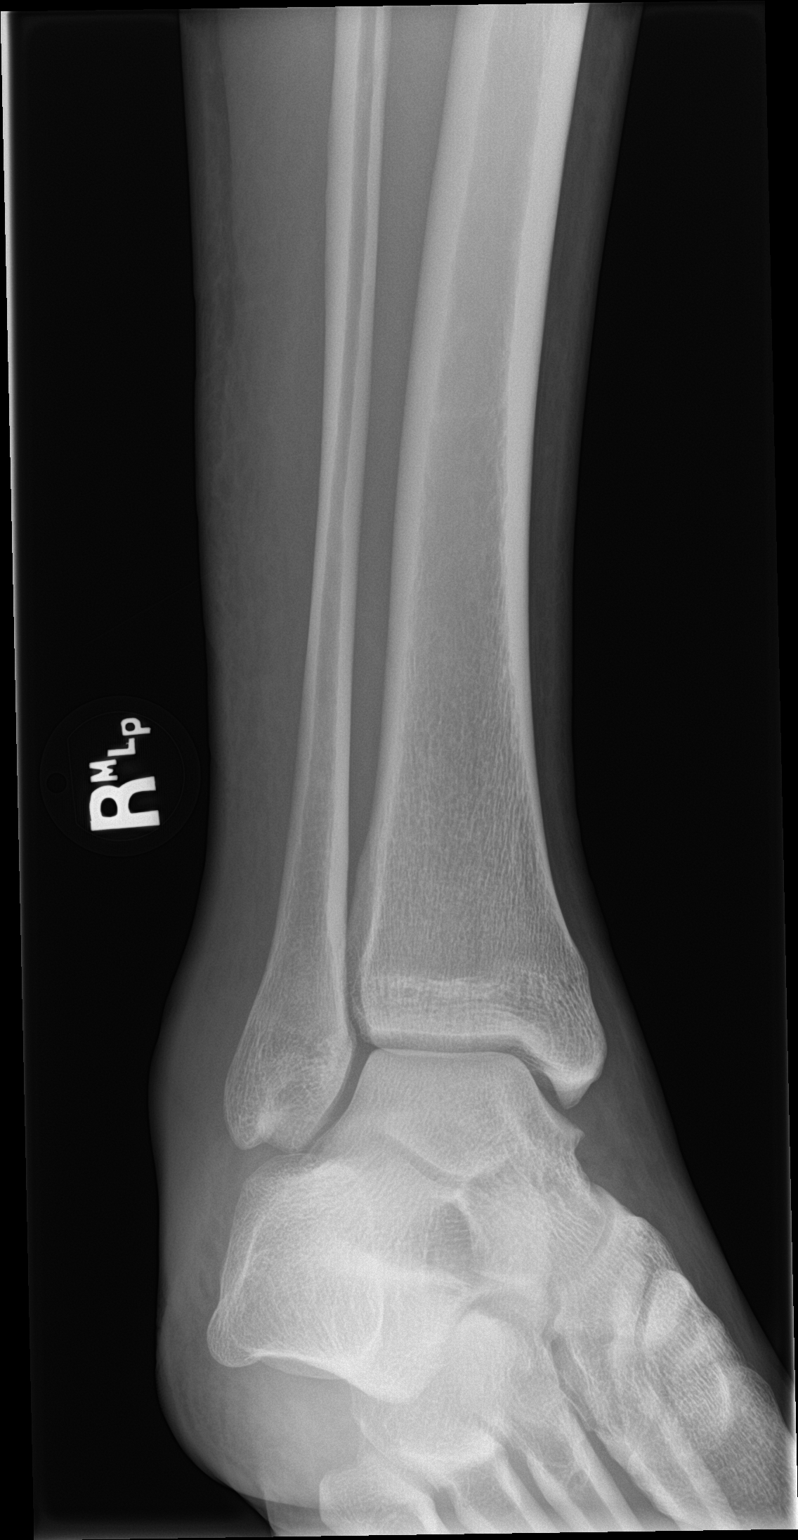

[ankle obl]
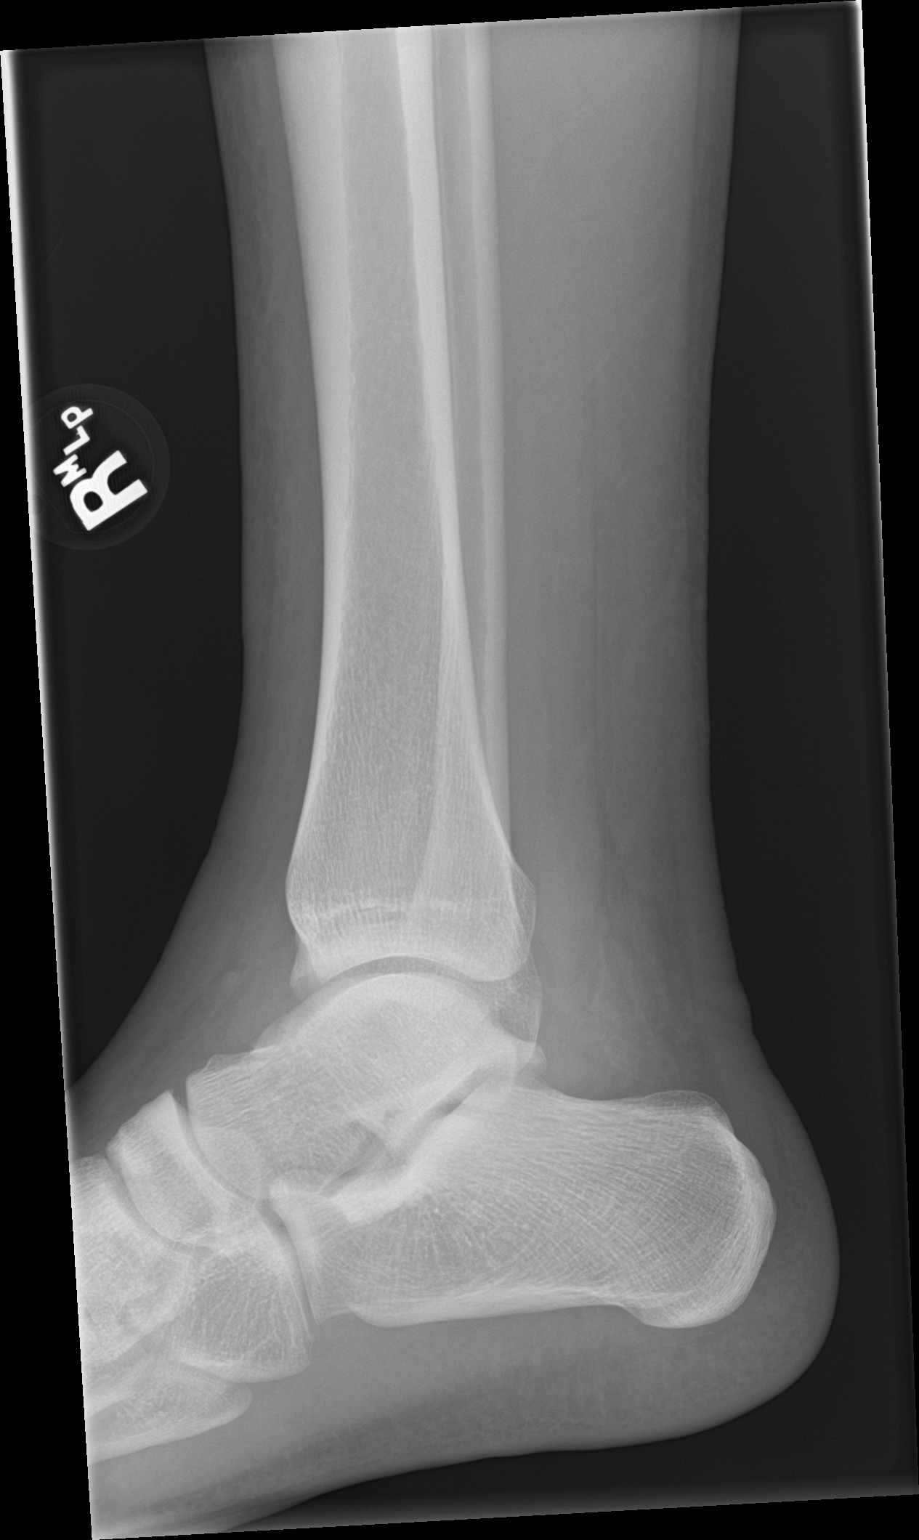

[ankle lat]
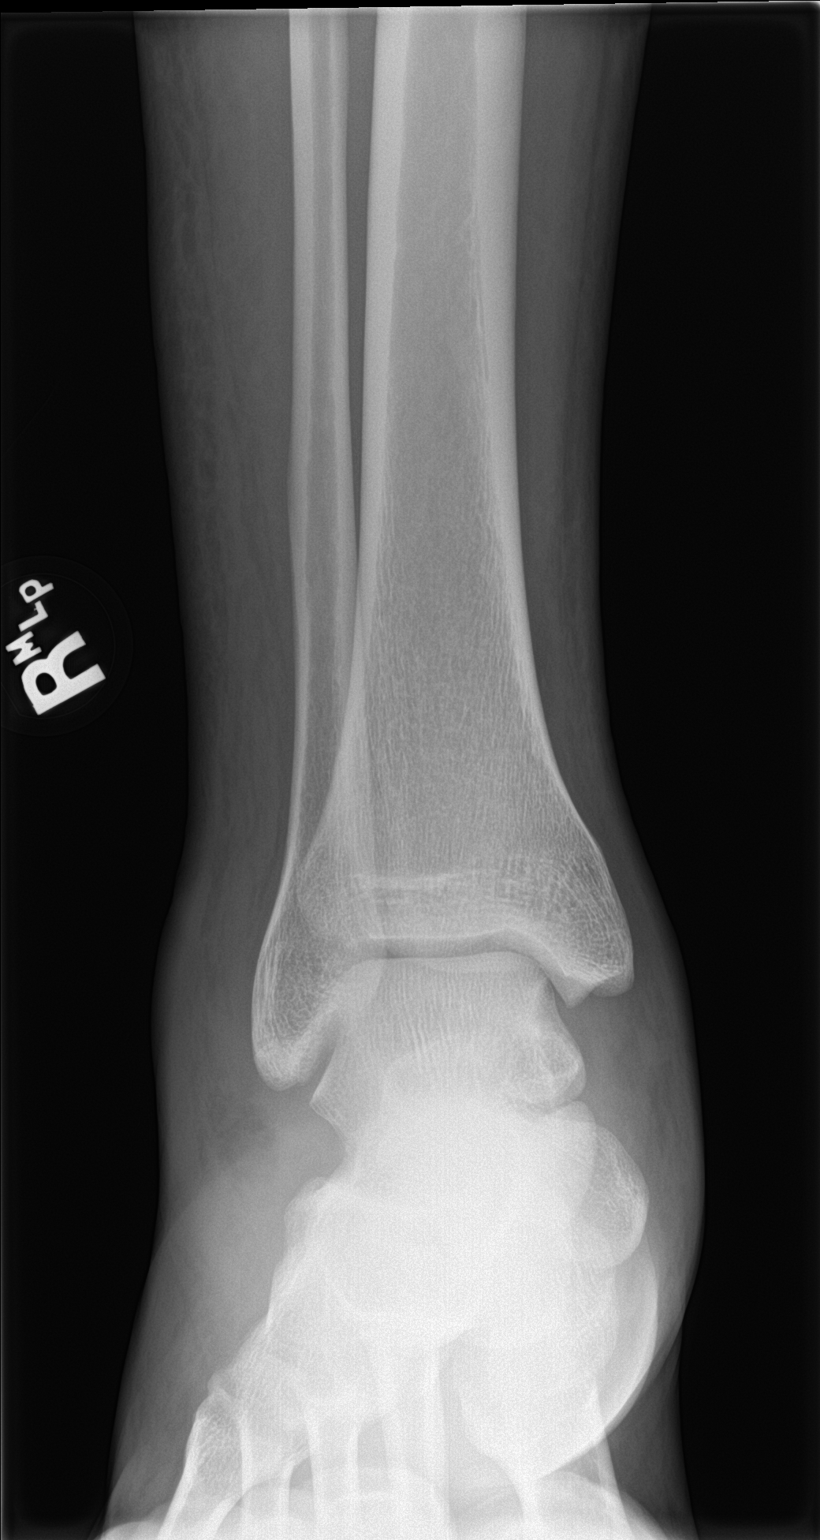

[3 of 3 positions shown; findings below may reference images not displayed]

FINDINGS: Osseous mineralization normal.

Ankle joint space preserved.

Diffuse soft tissue swelling greatest laterally.

No acute fracture, dislocation, or bone destruction.
IMPRESSION: Soft tissue swelling without acute osseous abnormalities.
